# Patient Record
Sex: Male | Born: 2000 | Race: White | Hispanic: No | Marital: Single | Smoking: Never smoker
Health system: Southern US, Community
[De-identification: ages and names within clinical notes are randomized; demographics above are authoritative.]

## PROBLEM LIST (undated history)

## (undated) DIAGNOSIS — E7800 Pure hypercholesterolemia, unspecified: Secondary | ICD-10-CM

## (undated) DIAGNOSIS — R519 Headache, unspecified: Secondary | ICD-10-CM

## (undated) DIAGNOSIS — F429 Obsessive-compulsive disorder, unspecified: Secondary | ICD-10-CM

## (undated) DIAGNOSIS — F840 Autistic disorder: Secondary | ICD-10-CM

## (undated) DIAGNOSIS — R51 Headache: Secondary | ICD-10-CM

## (undated) DIAGNOSIS — F913 Oppositional defiant disorder: Secondary | ICD-10-CM

## (undated) DIAGNOSIS — E669 Obesity, unspecified: Secondary | ICD-10-CM

## (undated) DIAGNOSIS — F988 Other specified behavioral and emotional disorders with onset usually occurring in childhood and adolescence: Secondary | ICD-10-CM

## (undated) DIAGNOSIS — F419 Anxiety disorder, unspecified: Secondary | ICD-10-CM

## (undated) DIAGNOSIS — R454 Irritability and anger: Secondary | ICD-10-CM

## (undated) DIAGNOSIS — F39 Unspecified mood [affective] disorder: Secondary | ICD-10-CM

---
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
## 2021-02-22 ENCOUNTER — Other Ambulatory Visit: Payer: Self-pay

## 2021-02-22 ENCOUNTER — Ambulatory Visit (HOSPITAL_COMMUNITY)
Admission: EM | Admit: 2021-02-22 | Discharge: 2021-02-22 | Disposition: A | Discharge status: Home / Self Care | Payer: PRIVATE HEALTH INSURANCE | Attending: Emergency Medicine | Admitting: Emergency Medicine

## 2021-02-22 ENCOUNTER — Encounter (HOSPITAL_COMMUNITY): Payer: Self-pay

## 2021-02-22 DIAGNOSIS — K5904 Chronic idiopathic constipation: Secondary | ICD-10-CM

## 2021-02-22 DIAGNOSIS — H6122 Impacted cerumen, left ear: Secondary | ICD-10-CM

## 2021-02-22 MED ORDER — LINACLOTIDE 145 MCG PO CAPS: 145.0000 ug | ORAL_CAPSULE | Freq: Every day | ORAL | 0 refills | Status: AC

## 2021-02-22 NOTE — ED Provider Notes (Addendum)
[facility]    CSN: [id number] Arrival date & time: 02/22/21  1932      History   Chief Complaint Chief Complaint  Patient presents with   Emesis   Weight Loss    HPI Jeffrey Barker. is a 20 y.o. male.   Pt reports pain under left ear x 1 hr, states he first noticed this while he was in the lobby.  Pt also reports nausea, vomiting, right lower quadrant pain for the past 2 weeks, adds that he vomits after eating, reports weight loss since July 2022, reports anorexia at this time.  The history is provided by the patient. The history is limited by a developmental delay.  Constipation Severity:  Moderate Time since last bowel movement:  2 days Timing:  Intermittent Progression:  Waxing and waning Chronicity:  Chronic Context: dehydration   Context comment:  Poor diet Stool description:  Pellet like, hard and small Relieved by:  None tried (Patient states he has been advised to take MiraLAX in the past but states that he refuses to do this.) Worsened by:  Nothing Ineffective treatments:  None tried Associated symptoms: abdominal pain, anorexia, hematochezia, nausea and vomiting   Associated symptoms: no back pain, no diarrhea, no dysuria, no fever, no flatus and no urinary retention   Abdominal pain:    Location:  LLQ and RLQ   Quality: bloating and fullness     Severity:  Moderate   Duration: Chronic.   Progression:  Waxing and waning   Chronicity:  Chronic Nausea:    Onset quality:  Unable to specify   Timing:  Intermittent   Progression:  Unchanged Risk factors comment:  Chronic idiopathic constipation  Past Medical History:  Diagnosis Date   Anger    Anxiety    Attention deficit disorder (ADD)    Autism    Headache    High cholesterol    Mood disorder (HCC)    Obesity    OCD (obsessive compulsive disorder)    Oppositional defiant disorder 04/10/2017    Patient Active Problem List   Diagnosis Date Noted   SIRS (systemic inflammatory  response syndrome) (HCC) 08/05/2017   Shock circulatory (HCC) 08/05/2017   Grief reaction    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 No pertinent surgical history.     Home Medications    Prior to Admission medications   Medication Sig Start Date End Date Taking? Authorizing Provider  linaclotide [facility]) 145 MCG CAPS capsule Take 1 capsule (145 mcg total) by mouth daily before breakfast. 02/22/21  Yes Theadora Rama Scales, PA-C  ARIPiprazole (ABILIFY) 5 MG tablet Take 5 mg by mouth daily.    [provider]  benztropine (COGENTIN) 0.5 MG tablet Take 1 tablet (0.5 mg total) by mouth daily. 08/04/17   Leata Mouse, MD  cloNIDine (CATAPRES) 0.1 MG tablet Take 1 tablet (0.1 mg total) by mouth at bedtime. 08/03/17   Leata Mouse, MD  FLUoxetine (PROZAC) 20 MG tablet Take 60 mg by mouth at bedtime.    [provider]  FLUoxetine 60 MG  TABS Take 60 mg by mouth daily. 08/04/17   Leata Mouse, MD  haloperidol (HALDOL) 5 MG tablet Take 1 tablet (5 mg total) by mouth daily. 08/04/17   Leata Mouse, MD  propranolol (INDERAL) 10 MG tablet Take 10 mg by mouth 2 (two) times daily.    [provider]    Family History Family History  Problem Relation Age of Onset   Mood Disorder Mother    Epilepsy Mother    Depression Mother    Pancreatic cancer Mother    Hypertension Mother    Gout Father    Hypertension Father    Mood Disorder Sister    Hypertension Sister     Heart disease Maternal Grandfather    Cancer Maternal Grandfather    Heart disease Paternal Grandfather    Heart failure Paternal Grandfather     Social History Social History   Tobacco Use   Smoking status: Never   Smokeless tobacco: Never  Substance Use Topics   Alcohol use: No   Drug use: No     Allergies   Patient has no known allergies.   Review of Systems Review of Systems  Constitutional:  Negative for fever.  Gastrointestinal:  Positive for abdominal pain, anorexia, constipation, hematochezia, nausea and vomiting. Negative for diarrhea and flatus.  Genitourinary:  Negative for dysuria.  Musculoskeletal:  Negative for back pain.    Physical Exam Triage Vital Signs ED Triage Vitals [02/22/21 2015]  Enc Vitals Group     BP      Pulse      Resp      Temp      Temp src      SpO2      Weight      Height      Head Circumference      Peak Flow      Pain Score 6     Pain Loc      Pain Edu?      Excl. in GC?    No data found.  Updated Vital Signs BP 117/76 (BP Location: Left Arm)   Pulse 86   Temp 98.5 F (36.9 C) (Oral)   Resp 18   Wt 146 lb 12.8 oz (66.6 kg)   SpO2 98%   Visual Acuity Right Eye Distance:   Left Eye Distance:   Bilateral Distance:    Right Eye Near:   Left Eye Near:    Bilateral Near:     Physical Exam Constitutional:      Appearance: Normal appearance.  HENT:     Head: Normocephalic and atraumatic.     Right Ear: Tympanic membrane, ear canal and external ear normal.     Left Ear: There is impacted cerumen.  Cardiovascular:     Rate and Rhythm: Normal rate and regular rhythm.     Heart sounds: Normal heart sounds.  Pulmonary:     Effort: Pulmonary effort is normal.     Breath sounds: Normal breath sounds.  Abdominal:     General: Abdomen is flat.     Palpations: Abdomen is soft.     Tenderness: There is abdominal tenderness (Right and left lower quadrant). There is no right CVA tenderness, left CVA tenderness,  guarding or rebound.     Comments: Bowel sounds absent in right and left lower quadrants.  Skin:    General: Skin is warm and dry.  Neurological:     General: No focal deficit present.     Mental Status:  He is alert and oriented to person, place, and time.  Psychiatric:        Mood and Affect: Mood normal.        Behavior: Behavior normal.     UC Treatments / Results  Labs (all labs ordered are listed, but only abnormal results are displayed) Labs Reviewed - No data to display  EKG   Radiology No results found.  Procedures Procedures (including critical care time)  Medications Ordered in UC Medications - No data to display  Initial Impression / Assessment and Plan / UC Course  I have reviewed the triage vital signs and the nursing notes.  Pertinent labs & imaging results that were available during my care of the patient were reviewed by me and considered in my medical decision making (see chart for details).     Cerumen impaction was noted in left ear on exam today.  Cerumen removal was performed with gentle irrigation by medical assistant, patient tolerated well, follow-up ear exam was normal.  Patient reports a history of chronic constipation.  I have advised patient to obtain a primary care provider for regular follow-up of this issue.  I provided him a 30-day prescription of Linzess to be taken daily, every other day or every third day as needed.  Patient verbalized understanding and agreement with plan. Final Clinical Impressions(s) / UC Diagnoses   Final diagnoses:  Impacted cerumen of left ear  Chronic idiopathic constipation     Discharge Instructions      For constipation, please begin Linzess 1 capsule daily in the morning.  If you find that you do not need to take this daily it is okay to take it every other day or every third day.  Please make sure you obtain a primary care provider for follow-up of your constipation, and less urgent medical  needs.     ED Prescriptions     Medication Sig Dispense Auth. Provider   linaclotide (LINZESS) 145 MCG CAPS capsule Take 1 capsule (145 mcg total) by mouth daily before breakfast. 30 capsule Theadora Rama Scales, PA-C      PDMP not reviewed this encounter.   Theadora Rama Scales, PA-C 02/22/21 2042    Theadora Rama Scales, PA-C 03/06/21 1055

## 2021-02-22 NOTE — ED Triage Notes (Signed)
Pt reports pain under left ear x 1 hr, he noticed when he was in the lobby.  Pt reports nausea, vomiting, right lower quadrant pain and  chest pain x 2 weeks. States he vomits after eating.   Pt reports weight loss since July 2022. Pt states he used to be around 200 lbs.

## 2021-02-22 NOTE — Discharge Instructions (Signed)
For constipation, please begin Linzess 1 capsule daily in the morning.  If you find that you do not need to take this daily it is okay to take it every other day or every third day.  Please make sure you obtain a primary care provider for follow-up of your constipation, and less urgent medical needs.
